# Patient Record
Sex: Male | Born: 1975 | Race: White | Hispanic: No | Marital: Married | Smoking: Never smoker
Health system: Southern US, Community
[De-identification: ages and names within clinical notes are randomized; demographics above are authoritative.]

## PROBLEM LIST (undated history)

## (undated) DIAGNOSIS — I10 Essential (primary) hypertension: Secondary | ICD-10-CM

## (undated) HISTORY — PX: HERNIA REPAIR: SHX51

## (undated) HISTORY — PX: APPENDECTOMY: SHX54

---

## 2014-05-14 ENCOUNTER — Other Ambulatory Visit: Payer: Self-pay | Admitting: Otolaryngology

## 2014-05-14 DIAGNOSIS — R070 Pain in throat: Secondary | ICD-10-CM

## 2014-06-17 ENCOUNTER — Ambulatory Visit
Admission: RE | Admit: 2014-06-17 | Discharge: 2014-06-17 | Disposition: A | Discharge status: Home / Self Care | Payer: PRIVATE HEALTH INSURANCE | Source: Ambulatory Visit | Attending: Otolaryngology | Admitting: Otolaryngology

## 2014-06-17 DIAGNOSIS — R070 Pain in throat: Secondary | ICD-10-CM

## 2014-06-17 MED ORDER — IOHEXOL 300 MG/ML  SOLN
75.0000 mL | Freq: Once | INTRAMUSCULAR | Status: AC | PRN
  Administered 2014-06-17: 75 mL via INTRAVENOUS

## 2018-06-13 ENCOUNTER — Emergency Department (HOSPITAL_COMMUNITY): Payer: PRIVATE HEALTH INSURANCE

## 2018-06-13 ENCOUNTER — Encounter (HOSPITAL_COMMUNITY): Payer: Self-pay | Admitting: Emergency Medicine

## 2018-06-13 ENCOUNTER — Other Ambulatory Visit: Payer: Self-pay

## 2018-06-13 ENCOUNTER — Emergency Department (HOSPITAL_COMMUNITY)
Admission: EM | Admit: 2018-06-13 | Discharge: 2018-06-13 | Disposition: A | Discharge status: Home / Self Care | Payer: PRIVATE HEALTH INSURANCE | Attending: Emergency Medicine | Admitting: Emergency Medicine

## 2018-06-13 DIAGNOSIS — N200 Calculus of kidney: Secondary | ICD-10-CM | POA: Diagnosis not present

## 2018-06-13 DIAGNOSIS — I10 Essential (primary) hypertension: Secondary | ICD-10-CM | POA: Diagnosis not present

## 2018-06-13 DIAGNOSIS — Z79899 Other long term (current) drug therapy: Secondary | ICD-10-CM | POA: Diagnosis not present

## 2018-06-13 DIAGNOSIS — R1084 Generalized abdominal pain: Secondary | ICD-10-CM | POA: Diagnosis present

## 2018-06-13 HISTORY — DX: Essential (primary) hypertension: I10

## 2018-06-13 MED ORDER — HYDROCODONE-ACETAMINOPHEN 5-325 MG PO TABS: 1.0000 | ORAL_TABLET | Freq: Four times a day (QID) | ORAL | 0 refills | Status: AC | PRN

## 2018-06-13 MED ORDER — TAMSULOSIN HCL 0.4 MG PO CAPS: 0.4000 mg | ORAL_CAPSULE | Freq: Every day | ORAL | 0 refills | Status: AC

## 2018-06-13 NOTE — Discharge Instructions (Signed)
As discussed, your evaluation today has been largely reassuring.  But, it is important that you monitor your condition carefully, and do not hesitate to return to the ED if you develop new, or concerning changes in your condition.  Otherwise, please follow-up with your physician for appropriate ongoing care.  In addition to the prescribed medication, please use ibuprofen, 400 mg, 3 times daily for additional relief.

## 2018-06-13 NOTE — ED Notes (Signed)
Patient transported to CT 

## 2018-06-13 NOTE — ED Triage Notes (Signed)
PT c/o hematuria, urinary frequency, and left sided flank pain x3 days. PT states he went to his PCP and was told to come to ED to rule out infection and kidney stones.

## 2018-06-13 NOTE — ED Provider Notes (Signed)
[facility]ENCY [facility]PARTMENT Provider Note   CSN: [id number] Arrival date & time: 06/13/18  1343     History   Chief Complaint Chief Complaint  Patient presents with  . Flank Pain    HPI Micheal HallsJoshua Holmes is a 42 y.o. male.  HPI Patient presents with left flank pain and hematuria. Patient has a history of kidney stones in the distant past. He was well until yesterday, when he noticed mild dull sensation in his left hemiscrotum, and bloody urine. The urine has cleared somewhat, but with persistent left flank soreness, nonradiating, moderate, he presented to his physician's office today. There he had urinalysis positive for blood, and given his flank pain he was sent here for evaluation. Pain is moderate, no medication taken for relief thus far. No penis pain, only mild left scrotal pain without swelling.  Past Medical History:  Diagnosis Date  . Hypertension     There are no active problems to display for this patient.   Past Surgical History:  Procedure Laterality Date  . APPENDECTOMY    . HERNIA REPAIR          Home Medications    Prior to Admission medications   Medication Sig Start Date End Date Taking? Authorizing Provider  co-enzyme Q-10 30 MG capsule Take 30 mg by mouth daily.   Yes [provider]  Omega-3 Fatty Acids (FISH OIL) 1200 MG CAPS Take 2 capsules by mouth at bedtime.   Yes [provider]  sertraline (ZOLOFT) 50 MG tablet Take 1 tablet by mouth daily. 03/29/18  Yes [provider]  valsartan-hydrochlorothiazide (DIOVAN-HCT) 160-25 MG tablet Take 1 tablet by mouth daily.   Yes [provider]  amLODipine (NORVASC) 5 MG tablet Take 1 tablet by mouth daily. 05/22/18   [provider]    Family History History reviewed. No pertinent family history.  Social History Social History   Tobacco Use  . Smoking status: Never Smoker  . Smokeless tobacco: Never Used  Substance Use Topics  . Alcohol use:  Yes    Comment: socially  . Drug use: Never     Allergies   Patient has no known allergies.   Review of Systems Review of Systems  Constitutional:       Per HPI, otherwise negative  HENT:       Per HPI, otherwise negative  Respiratory:       Per HPI, otherwise negative  Cardiovascular:       Per HPI, otherwise negative  Gastrointestinal: Negative for vomiting.  Endocrine:       Negative aside from HPI  Genitourinary:       Neg aside from HPI   Musculoskeletal:       Per HPI, otherwise negative  Skin: Negative.   Neurological: Negative for syncope.     Physical Exam Updated Vital Signs BP (!) 141/87 (BP Location: Right Arm)   Pulse 81   Temp 97.9 F (36.6 C) (Oral)   Resp 18   Ht 5\' 10"  (1.778 m)   Wt 130.2 kg   SpO2 96%   BMI 41.18 kg/m   Physical Exam  Constitutional: He is oriented to person, place, and time. He appears well-developed. No distress.  HENT:  Head: Normocephalic and atraumatic.  Eyes: Conjunctivae and EOM are normal.  Cardiovascular: Normal rate and regular rhythm.  Pulmonary/Chest: Effort normal. No stridor. No respiratory distress.  Abdominal: He exhibits no distension and no mass. There is no tenderness. There is no guarding.  Musculoskeletal: He exhibits no edema.  Neurological: He is alert and oriented to person, place, and time.  Skin: Skin is warm and dry.  Psychiatric: He has a normal mood and affect.  Nursing note and vitals reviewed.    ED Treatments / Results  Labs UA performed earlier today  Radiology Ct Renal Stone Study  Result Date: 06/13/2018 CLINICAL DATA:  Left lower quadrant flank pain for 3 days, hematuria, history of kidney stones previously EXAM: CT ABDOMEN AND PELVIS WITHOUT CONTRAST TECHNIQUE: Multidetector CT imaging of the abdomen and pelvis was performed following the standard protocol without IV contrast. COMPARISON:  CT abdomen pelvis of 10/18/2014. The CT abdomen pelvis of 07/11/2016 is not currently  available for comparison. FINDINGS: Lower chest: Images through the lung bases show no significant abnormality. The heart is within normal limits in size. Considerable epicardial fat is present. Hepatobiliary: The liver is low in attenuation suggesting hepatic steatosis. No focal hepatic abnormality is seen. The gallbladder is contracted and no calcified gallstones are evident. Pancreas: The pancreas is normal in size with some fatty infiltration present. The pancreatic duct is not dilated. Spleen: The spleen is within upper limits of normal in size. Adrenals/Urinary Tract: The adrenal glands appear normal. No renal calculi are seen. However there is a moderate left hydronephrosis and hydroureter present to a point only a few cm from the left UV junction where there is a 5 mm calculus present creating moderate obstruction. The right ureter is normal in caliber. The urinary bladder is moderately well distended with no abnormality evident. Stomach/Bowel: The stomach is moderately distended with fluid and food debris. No abnormality is seen. No small bowel distention or edema is noted. There is a small umbilical hernia present containing a nondilated loop of small bowel. The colon is nondilated. The terminal ileum is unremarkable and the appendix apparently has been resected previously. Small clips are present within the right lower quadrant within mesenteric fat. Vascular/Lymphatic: The abdominal aorta is normal in caliber. No adenopathy is seen. Reproductive: The prostate gland is normal in size. Other: As noted above a small periumbilical hernia is present containing a nondilated loop of small bowel. Musculoskeletal: The lumbar vertebrae are in normal alignment with perhaps mild retrolisthesis of L5 on S1 by a few mm. This most likely is due to degenerative change of the facet joints. No pars defect is seen. Intervertebral disc spaces appear normal. The SI joints are well corticated. IMPRESSION: 1. Moderate left  hydronephrosis and hydroureter to point of obstruction by 5 mm distal left ureteral calculus a few cm above the expected left UV junction. 2. No additional renal calculi are seen. 3. Suspect hepatic steatosis.  No focal hepatic abnormality. 4. Small periumbilical hernia containing a nondilated loop of small bowel. Electronically Signed   By: Dwyane Dee M.D.   On: 06/13/2018 16:16    Procedures Procedures (including critical care time)   Initial Impression / Assessment and Plan / ED Course  I have reviewed the triage vital signs and the nursing notes.  Pertinent labs & imaging results that were available during my care of the patient were reviewed by me and considered in my medical decision making (see chart for details).     4:39 PM On repeat exam the patient remains awake and alert, sitting upright, using a cellular telephone. We discussed all findings, including CT results which I reviewed. Patient found to have 5 mm left UVJ stone, no evidence for complete obstruction, and with no distress, no reported  infection on urinalysis performed earlier today, and no fever, patient is appropriate for close outpatient follow-up with urology.   Final Clinical Impressions(s) / ED Diagnoses  Nephrolithiasis   Gerhard Munch, MD 06/13/18 [phone]

## 2018-06-13 NOTE — ED Notes (Signed)
EDP at bedside  

## 2018-06-13 NOTE — ED Notes (Signed)
Pt returned from CT °

## 2018-06-13 NOTE — ED Notes (Signed)
EDP at bedside updating patient. 

## 2020-02-10 IMAGING — CT CT RENAL STONE PROTOCOL
2 of 4 series · 15 of 46 positions shown, 17 images · non-contrast
Comparison: CT abdomen pelvis of 10/18/2014. The CT abdomen pelvis
of 07/11/2016 is not currently available for comparison.

CLINICAL DATA: Left lower quadrant flank pain for 3 days,
hematuria, history of kidney stones previously

EXAM:
CT ABDOMEN AND PELVIS WITHOUT CONTRAST
TECHNIQUE: Multidetector CT imaging of the abdomen and pelvis was performed
following the standard protocol without IV contrast.

[Series 2: axial st · axial · 0.86mm/px · z∈[+923,+1403]mm · 12 of 110 slices shown, 14 images]
[im 9/110  soft-tissue]
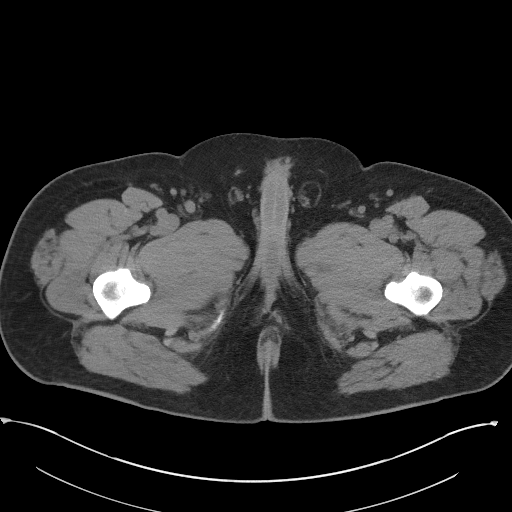
[im 9/110  bone]
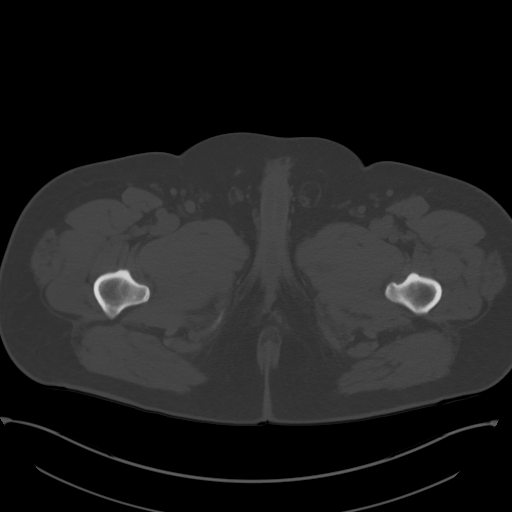
[im 18/110  soft-tissue]
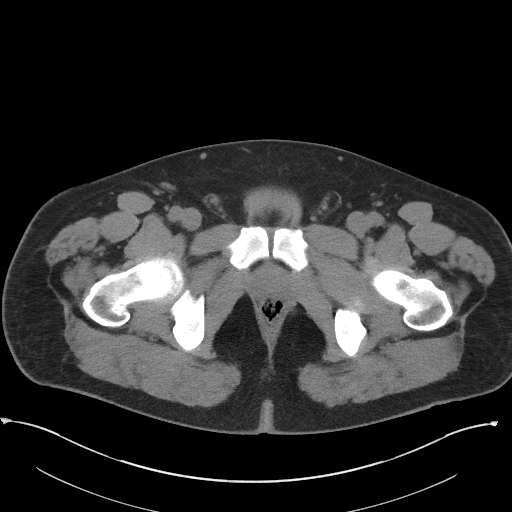
[im 27/110  soft-tissue]
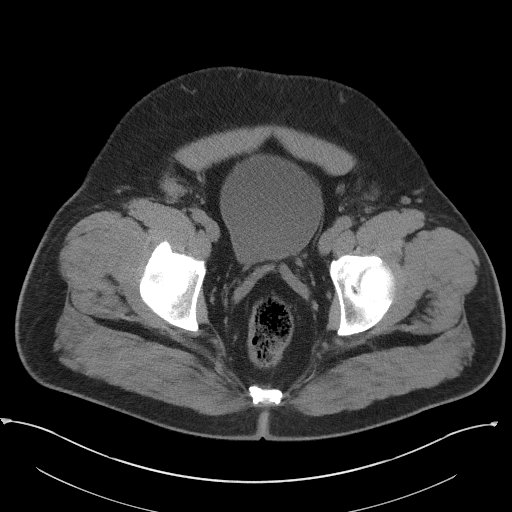
[im 35/110  soft-tissue]
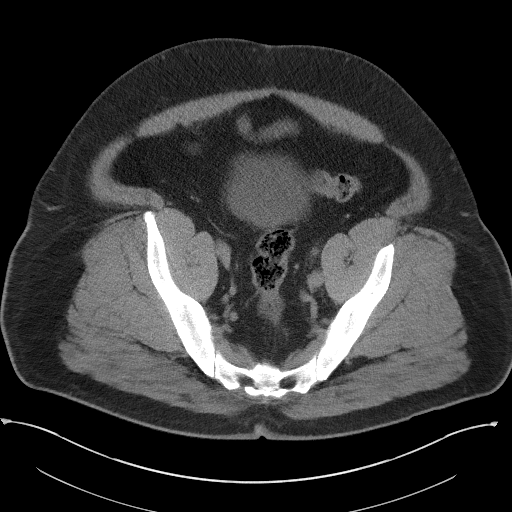
[im 44/110  soft-tissue]
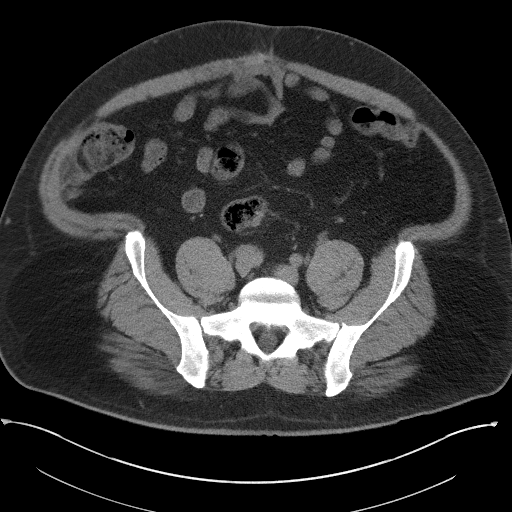
[im 53/110  soft-tissue]
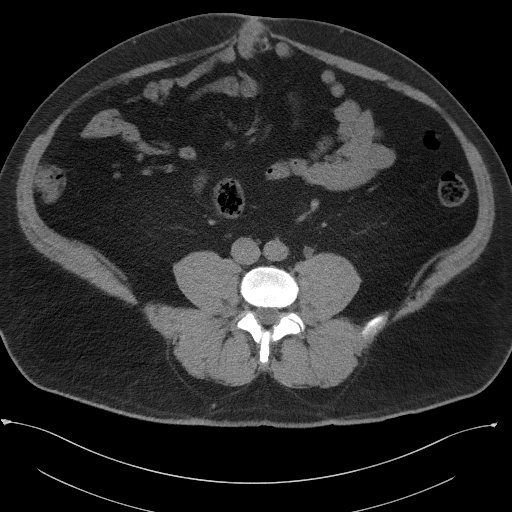
[im 62/110  soft-tissue]
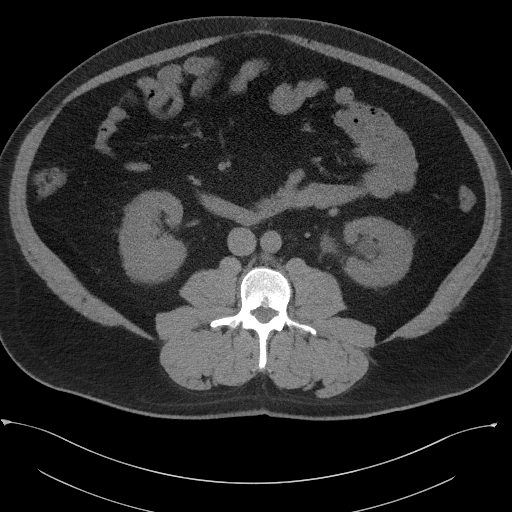
[im 70/110  soft-tissue]
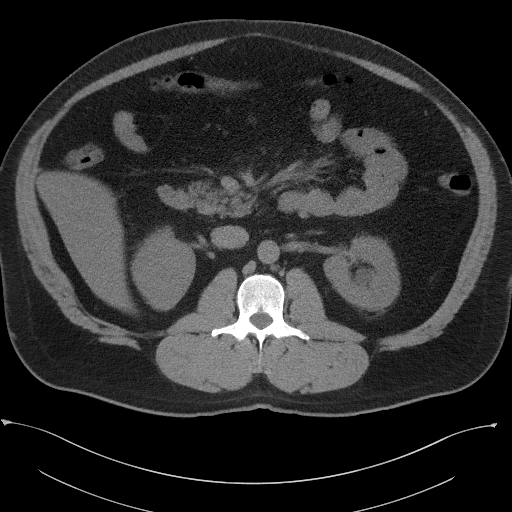
[im 79/110  soft-tissue]
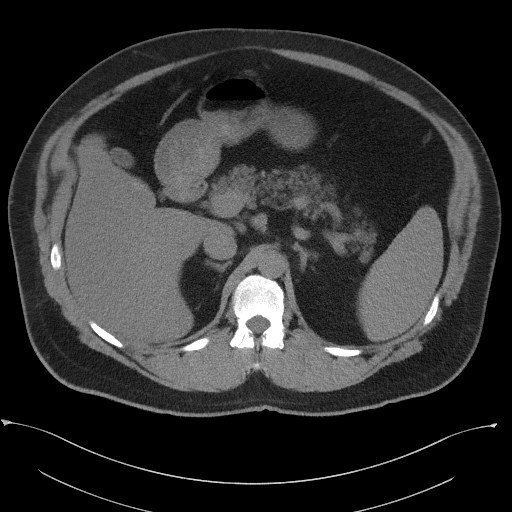
[im 79/110  bone]
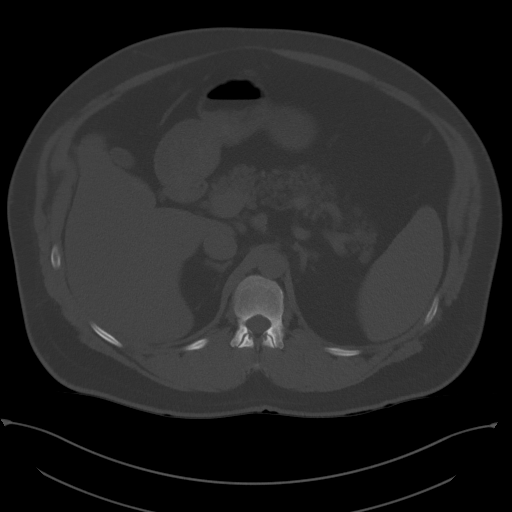
[im 88/110  soft-tissue]
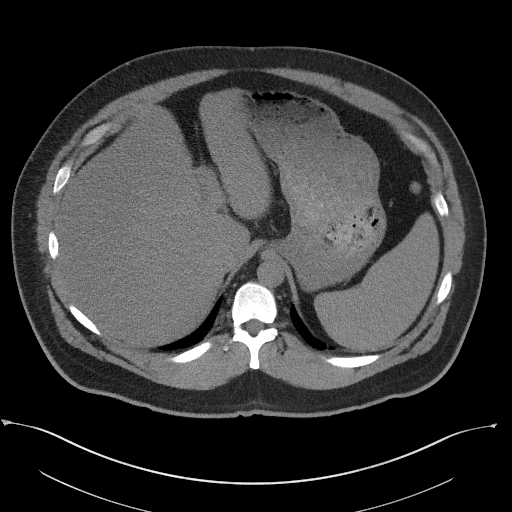
[im 96/110  soft-tissue]
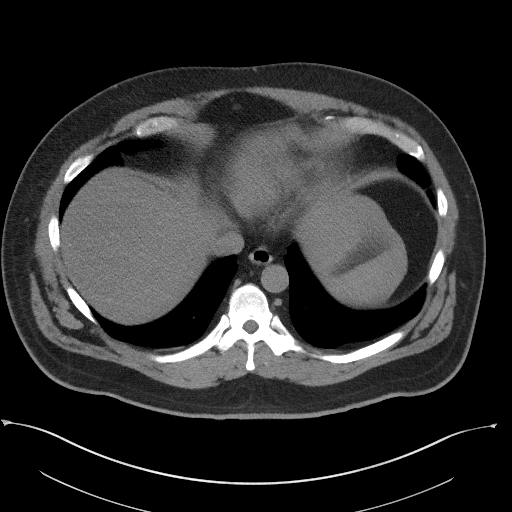
[im 105/110  soft-tissue]
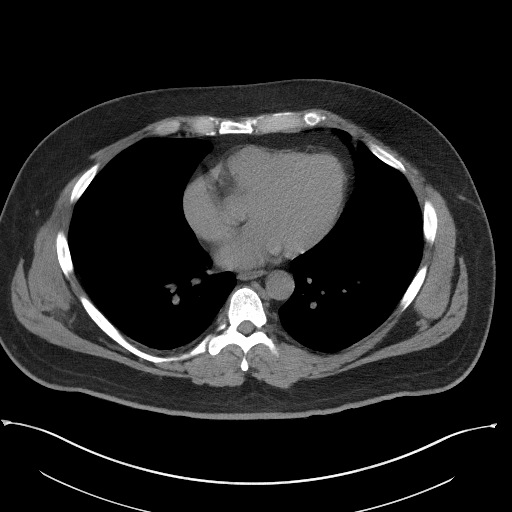

[Series 5: coronal st · coronal · 0.90mm/px · 3 of 122 slices shown]
[im 41/122  soft-tissue]
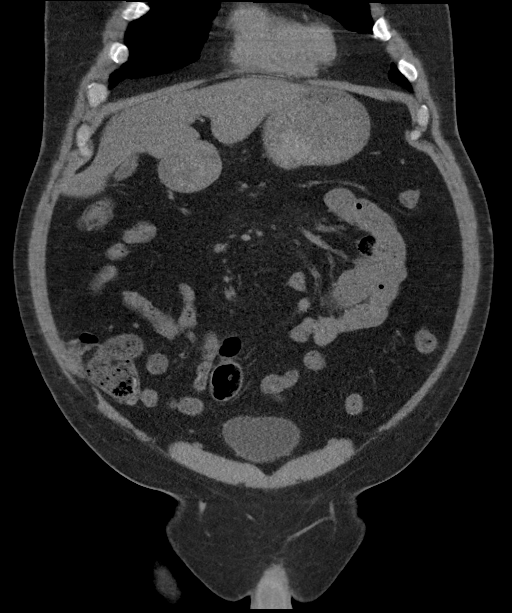
[im 54/122  soft-tissue]
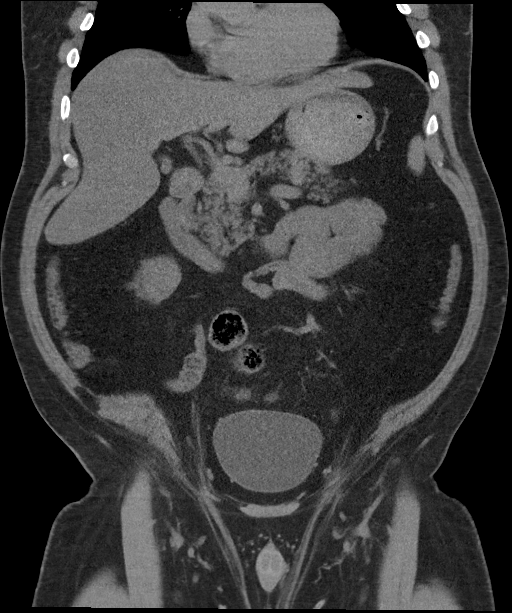
[im 68/122  soft-tissue]
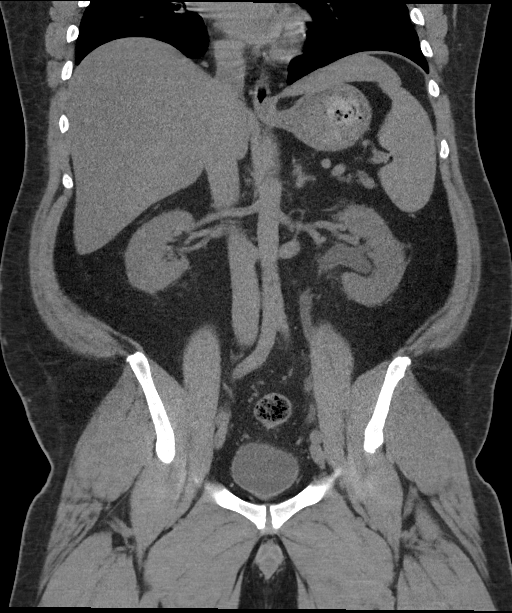

[15 of 46 positions shown; findings below may reference images not displayed]

FINDINGS: Lower chest: Images through the lung bases show no significant
abnormality. The heart is within normal limits in size. Considerable
epicardial fat is present.

Hepatobiliary: The liver is low in attenuation suggesting hepatic
steatosis. No focal hepatic abnormality is seen. The gallbladder is
contracted and no calcified gallstones are evident.

Pancreas: The pancreas is normal in size with some fatty
infiltration present. The pancreatic duct is not dilated.

Spleen: The spleen is within upper limits of normal in size.

Adrenals/Urinary Tract: The adrenal glands appear normal. No renal
calculi are seen. However there is a moderate left hydronephrosis
and hydroureter present to a point only a few cm from the left UV
junction where there is a 5 mm calculus present creating moderate
obstruction. The right ureter is normal in caliber. The urinary
bladder is moderately well distended with no abnormality evident.

Stomach/Bowel: The stomach is moderately distended with fluid and
food debris. No abnormality is seen. No small bowel distention or
edema is noted. There is a small umbilical hernia present containing
a nondilated loop of small bowel. The colon is nondilated. The
terminal ileum is unremarkable and the appendix apparently has been
resected previously. Small clips are present within the right lower
quadrant within mesenteric fat.

Vascular/Lymphatic: The abdominal aorta is normal in caliber. No
adenopathy is seen.

Reproductive: The prostate gland is normal in size.

Other: As noted above a small periumbilical hernia is present
containing a nondilated loop of small bowel.

Musculoskeletal: The lumbar vertebrae are in normal alignment with
perhaps mild retrolisthesis of L5 on S1 by a few mm. This most
likely is due to degenerative change of the facet joints. No pars
defect is seen. Intervertebral disc spaces appear normal. The SI
joints are well corticated.
IMPRESSION: 1. Moderate left hydronephrosis and hydroureter to point of
obstruction by 5 mm distal left ureteral calculus a few cm above the
expected left UV junction.
2. No additional renal calculi are seen.
3. Suspect hepatic steatosis.  No focal hepatic abnormality.
4. Small periumbilical hernia containing a nondilated loop of small
bowel.

## 2023-06-15 ENCOUNTER — Other Ambulatory Visit (INDEPENDENT_AMBULATORY_CARE_PROVIDER_SITE_OTHER): Payer: Self-pay

## 2023-06-15 ENCOUNTER — Encounter: Payer: Self-pay | Admitting: Orthopaedic Surgery

## 2023-06-15 ENCOUNTER — Ambulatory Visit (INDEPENDENT_AMBULATORY_CARE_PROVIDER_SITE_OTHER): Payer: No Typology Code available for payment source | Admitting: Orthopaedic Surgery

## 2023-06-15 VITALS — Ht 70.0 in | Wt 270.0 lb

## 2023-06-15 DIAGNOSIS — M170 Bilateral primary osteoarthritis of knee: Secondary | ICD-10-CM

## 2023-06-15 DIAGNOSIS — M25562 Pain in left knee: Secondary | ICD-10-CM | POA: Diagnosis not present

## 2023-06-15 DIAGNOSIS — M1712 Unilateral primary osteoarthritis, left knee: Secondary | ICD-10-CM

## 2023-06-15 MED ORDER — METHYLPREDNISOLONE ACETATE 40 MG/ML IJ SUSP
40.0000 mg | INTRAMUSCULAR | Status: AC | PRN
  Administered 2023-06-15: 40 mg via INTRA_ARTICULAR

## 2023-06-15 MED ORDER — LIDOCAINE HCL 1 % IJ SOLN
0.5000 mL | INTRAMUSCULAR | Status: AC | PRN
  Administered 2023-06-15: .5 mL

## 2023-06-15 MED ORDER — BUPIVACAINE HCL 0.5 % IJ SOLN
3.0000 mL | INTRAMUSCULAR | Status: AC | PRN
  Administered 2023-06-15: 3 mL via INTRA_ARTICULAR

## 2023-06-15 NOTE — Progress Notes (Addendum)
Office Visit Note   Patient: Micheal Holmes           Date of Birth: 1976/02/10           MRN: [id number] Visit Date: 06/15/2023              Requested by: Kirstie Peri, MD [address] [place],  [place] [address] PCP: Kirstie Peri, MD   Assessment & Plan: Visit Diagnoses:  1. Acute pain of left knee   2. Bilateral primary osteoarthritis of knee     Plan: Left knee injection performed to watch sugar for the next week and will walk or ride exercise bike if he has any hypoglycemia.  If he has persistent problems or develops true locking will need to proceed with MRI scan for likely medial meniscal tear.  We reviewed the x-rays which showed he has more advanced arthritis in his opposite right knee but is not bothering him at all.  He will follow-up and let us know if he is having ongoing left knee problems.  Follow-Up Instructions: No follow-ups on file.   Orders:  Orders Placed This Encounter  Procedures   Large Joint Inj   XR KNEE 3 VIEW LEFT   No orders of the defined types were placed in this encounter.     Procedures: Large Joint Inj: L knee on 06/15/2023 10:47 AM Indications: joint swelling and pain Details: 22 G 1.5 in needle, anterolateral approach  Arthrogram: No  Medications: 0.5 mL lidocaine 1 %; 3 mL bupivacaine 0.5 %; 40 mg methylPREDNISolone acetate 40 MG/ML Outcome: tolerated well, no immediate complications Procedure, treatment alternatives, risks and benefits explained, specific risks discussed. Consent was given by the patient. Immediately prior to procedure a time out was called to verify the correct patient, procedure, equipment, support staff and site/side marked as required. Patient was prepped and draped in the usual sterile fashion.       Clinical Data: No additional findings.   Subjective: Chief Complaint  Patient presents with   Left Knee - Pain    HPI 47 year old male [employer] sent by the [facility] for left knee medial joint line pain.  He is a Scientist, research (physical sciences) he states he has some diabetes lost weight is on oral medication A1c has been less than 6 and he states when he stands for a long time sometimes he has to put his left lower extremity and nonweightbearing position wiggle little bit and then it feels better.  No problems with his opposite right knee.  Remote history of patellar dislocation distant past.  Review of Systems positive diabetes all systems noncontributory HPI.   Objective: Vital Signs: Ht 5\' 10"  (1.778 m)   Wt 270 lb (122.5 kg)   BMI 38.74 kg/m   Physical Exam Constitutional:      Appearance: He is well-developed.  HENT:     Head: Normocephalic and atraumatic.     Right Ear: External ear normal.     Left Ear: External ear normal.  Eyes:     Pupils: Pupils are equal, round, and reactive to light.  Neck:     Thyroid: No thyromegaly.     Trachea: No tracheal deviation.  Cardiovascular:     Rate and Rhythm: Normal rate.  Pulmonary:     Effort: Pulmonary effort is normal.     Breath sounds: No wheezing.  Abdominal:     General: Bowel sounds are normal.     Palpations: Abdomen is soft.  Musculoskeletal:  Cervical back: Neck supple.  Skin:    General: Skin is warm and dry.     Capillary Refill: Capillary refill takes less than 2 seconds.  Neurological:     Mental Status: He is alert and oriented to person, place, and time.  Psychiatric:        Behavior: Behavior normal.        Thought Content: Thought content normal.        Judgment: Judgment normal.     Ortho Exam right knee medial joint line tenderness bilateral knee crepitus with range of motion.  Slight medial joint laxity more right knee than left knee.  ACL PCL exam is normal right and left knee.  Specialty Comments:  No specialty comments available.  Imaging: XR KNEE 3 VIEW LEFT  Result Date: 06/15/2023 Standing AP both knees lateral left knee sunrise patella x-ray demonstrates left knee 50% joint space narrowing.  Patellofemoral spurring  lateral patellofemoral compartment.  Opposite right knee shows medial bone-on-bone changes with spurring. Impression: Knee osteoarthritis worse on the right knee than left knee.  Left knee primary medial compartment.    PMFS History: Patient Active Problem List   Diagnosis Date Noted   Bilateral primary osteoarthritis of knee 06/15/2023   Past Medical History:  Diagnosis Date   Hypertension     No family history on file.  Past Surgical History:  Procedure Laterality Date   APPENDECTOMY     HERNIA REPAIR     Social History   Occupational History   Not on file  Tobacco Use   Smoking status: Never   Smokeless tobacco: Never  Vaping Use   Vaping status: Never Used  Substance and Sexual Activity   Alcohol use: Yes    Comment: socially   Drug use: Never   Sexual activity: Not on file

## 2023-08-10 ENCOUNTER — Ambulatory Visit: Payer: No Typology Code available for payment source | Admitting: Orthopaedic Surgery

## 2023-08-11 ENCOUNTER — Encounter: Payer: Self-pay | Admitting: Orthopaedic Surgery

## 2023-08-11 ENCOUNTER — Ambulatory Visit: Payer: No Typology Code available for payment source | Admitting: Orthopaedic Surgery

## 2023-08-11 VITALS — Ht 70.0 in | Wt 270.0 lb

## 2023-08-11 DIAGNOSIS — M2242 Chondromalacia patellae, left knee: Secondary | ICD-10-CM

## 2023-08-11 NOTE — Addendum Note (Signed)
Addended by: Rogers Seeds on: 08/11/2023 10:59 AM   Modules accepted: Orders

## 2023-08-11 NOTE — Progress Notes (Signed)
Office Visit Note   Patient: Micheal Holmes           Date of Birth: 22-May-1976           MRN: [id number] Visit Date: 08/11/2023              Requested by: Kirstie Peri, MD [address] [place],  [place] [address] PCP: Kirstie Peri, MD   Assessment & Plan: Visit Diagnoses:  1. Chondromalacia patellae, left knee   2.     Hx of left knee patellar dislocation  Plan: Patient's previous x-rays showed 50% joint space narrowing and some patellofemoral degenerative changes he is now having sharp In the patellofemoral joint.  Will proceed with the MRI scan left knee to evaluate him for chondromalacia patella versus abnormal patellar tracking with previous history of patellar dislocation.  He will return after MRI scan.  Follow-Up Instructions: No follow-ups on file.   Orders:  No orders of the defined types were placed in this encounter.  No orders of the defined types were placed in this encounter.     Procedures: No procedures performed   Clinical Data: No additional findings.   Subjective: Chief Complaint  Patient presents with   Left Knee - Pain, Follow-up    HPI 48 year old male returns post knee knee injection in December without relief.  History of patellar dislocation when he was younger.  Patient was in the [employer].  Patient has throbbing pain with sitting problems with patellofemoral loading and sharp catching of the patellofemoral joint.  Plain radiographs did show some knee osteoarthritis.  Review of Systems updated unchanged.   Objective: Vital Signs: Ht 5\' 10"  (1.778 m)   Wt 270 lb (122.5 kg)   BMI 38.74 kg/m   Physical Exam Constitutional:      Appearance: He is well-developed.  HENT:     Head: Normocephalic and atraumatic.     Right Ear: External ear normal.     Left Ear: External ear normal.  Eyes:     Pupils: Pupils are equal, round, and reactive to light.  Neck:     Thyroid: No thyromegaly.     Trachea: No tracheal deviation.  Cardiovascular:      Rate and Rhythm: Normal rate.  Pulmonary:     Effort: Pulmonary effort is normal.     Breath sounds: No wheezing.  Abdominal:     General: Bowel sounds are normal.     Palpations: Abdomen is soft.  Musculoskeletal:     Cervical back: Neck supple.  Skin:    General: Skin is warm and dry.     Capillary Refill: Capillary refill takes less than 2 seconds.  Neurological:     Mental Status: He is alert and oriented to person, place, and time.  Psychiatric:        Behavior: Behavior normal.        Thought Content: Thought content normal.        Judgment: Judgment normal.     Ortho Exam patient with crepitus right and left knee with knee flexion and extension.  Negative apprehension patellar subluxation test on the left but he does have significant pain with patellofemoral loading and quadriceps contracture.  Collateral ligaments are stable ACL PCL stable.  Specialty Comments:  No specialty comments available.  Imaging: No results found.   PMFS History: Patient Active Problem List   Diagnosis Date Noted   Chondromalacia patellae, left knee 08/11/2023   Bilateral primary osteoarthritis of knee 06/15/2023   Past Medical  History:  Diagnosis Date   Hypertension     No family history on file.  Past Surgical History:  Procedure Laterality Date   APPENDECTOMY     HERNIA REPAIR     Social History   Occupational History   Not on file  Tobacco Use   Smoking status: Never   Smokeless tobacco: Never  Vaping Use   Vaping status: Never Used  Substance and Sexual Activity   Alcohol use: Yes    Comment: socially   Drug use: Never   Sexual activity: Not on file

## 2023-08-15 ENCOUNTER — Telehealth: Payer: Self-pay | Admitting: Radiology

## 2023-08-15 NOTE — Telephone Encounter (Signed)
Please see below message from [place] office. Can you help?  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  He was referred to [place] from [facility].  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  I don't know how to get referrals from the [place].  Is there someone in the [place] office that does that?    Thanks,  [employer]

## 2023-08-15 NOTE — Telephone Encounter (Signed)
 noted

## 2023-08-22 NOTE — Telephone Encounter (Signed)
 Received fax from [place] with [facility] added to referral.  This has been emailed to [place] office for Dennis to send to [facility] and have MRI scheduled. I called Soyla Duverney to advise.

## 2023-09-06 ENCOUNTER — Telehealth: Payer: Self-pay | Admitting: Radiology

## 2023-09-06 NOTE — Telephone Encounter (Signed)
 Please see message from [place] office below and advise. You were going to call patient with results.  Results available in Care Everywhere from [facility].   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  His number is [phone]

## 2023-11-11 ENCOUNTER — Encounter: Payer: Self-pay | Admitting: Radiology

## 2024-05-14 ENCOUNTER — Encounter: Payer: Self-pay | Admitting: Radiology

## 2024-06-27 ENCOUNTER — Encounter (INDEPENDENT_AMBULATORY_CARE_PROVIDER_SITE_OTHER): Payer: Self-pay | Admitting: *Deleted

## 2024-08-14 ENCOUNTER — Emergency Department (HOSPITAL_COMMUNITY)

## 2024-08-14 ENCOUNTER — Other Ambulatory Visit: Payer: Self-pay

## 2024-08-14 ENCOUNTER — Emergency Department (HOSPITAL_COMMUNITY)
Admission: EM | Admit: 2024-08-14 | Discharge: 2024-08-14 | Disposition: A | Discharge status: Home / Self Care | Attending: Emergency Medicine | Admitting: Emergency Medicine

## 2024-08-14 ENCOUNTER — Encounter (HOSPITAL_COMMUNITY): Payer: Self-pay | Admitting: *Deleted

## 2024-08-14 DIAGNOSIS — Z7984 Long term (current) use of oral hypoglycemic drugs: Secondary | ICD-10-CM | POA: Insufficient documentation

## 2024-08-14 DIAGNOSIS — S060X1A Concussion with loss of consciousness of 30 minutes or less, initial encounter: Secondary | ICD-10-CM | POA: Insufficient documentation

## 2024-08-14 DIAGNOSIS — Y92410 Unspecified street and highway as the place of occurrence of the external cause: Secondary | ICD-10-CM | POA: Insufficient documentation

## 2024-08-14 DIAGNOSIS — S0081XA Abrasion of other part of head, initial encounter: Secondary | ICD-10-CM | POA: Insufficient documentation

## 2024-08-14 DIAGNOSIS — E119 Type 2 diabetes mellitus without complications: Secondary | ICD-10-CM | POA: Insufficient documentation

## 2024-08-14 LAB — BASIC METABOLIC PANEL WITH GFR
Anion gap: 16 — ABNORMAL HIGH (ref 5–15)
BUN: 17 mg/dL (ref 6–20)
CO2: 21 mmol/L — ABNORMAL LOW (ref 22–32)
Calcium: 9.9 mg/dL (ref 8.9–10.3)
Chloride: 105 mmol/L (ref 98–111)
Creatinine, Ser: 1 mg/dL (ref 0.61–1.24)
GFR, Estimated: >60 mL/min
Glucose, Bld: 121 mg/dL — ABNORMAL HIGH (ref 70–99)
Potassium: 3.4 mmol/L — ABNORMAL LOW (ref 3.5–5.1)
Sodium: 142 mmol/L (ref 135–145)

## 2024-08-14 LAB — CBC WITH DIFFERENTIAL/PLATELET
Abs Immature Granulocytes: 0.07 10*3/uL (ref 0.00–0.07)
Basophils Absolute: 0 10*3/uL (ref 0.0–0.1)
Basophils Relative: 0 %
Eosinophils Absolute: 0.1 10*3/uL (ref 0.0–0.5)
Eosinophils Relative: 1 %
HCT: 41.7 % (ref 39.0–52.0)
Hemoglobin: 14.3 g/dL (ref 13.0–17.0)
Immature Granulocytes: 1 %
Lymphocytes Relative: 18 %
Lymphs Abs: 2.2 10*3/uL (ref 0.7–4.0)
MCH: 29.1 pg (ref 26.0–34.0)
MCHC: 34.3 g/dL (ref 30.0–36.0)
MCV: 84.9 fL (ref 80.0–100.0)
Monocytes Absolute: 0.8 10*3/uL (ref 0.1–1.0)
Monocytes Relative: 6 %
Neutro Abs: 8.7 10*3/uL — ABNORMAL HIGH (ref 1.7–7.7)
Neutrophils Relative %: 74 %
Platelets: 186 10*3/uL (ref 150–400)
RBC: 4.91 MIL/uL (ref 4.22–5.81)
RDW: 12.9 % (ref 11.5–15.5)
WBC: 11.8 10*3/uL — ABNORMAL HIGH (ref 4.0–10.5)
nRBC: 0 % (ref 0.0–0.2)

## 2024-08-14 MED ORDER — ACETAMINOPHEN 500 MG PO TABS: 1000.0000 mg | ORAL_TABLET | Freq: Once | ORAL | Status: DC

## 2024-08-14 NOTE — ED Notes (Signed)
Pt ambulatory to room.
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
